# Patient Record
Sex: Female | Born: 1995 | Hispanic: Yes | Marital: Single | Smoking: Never smoker
Health system: Southern US, Community
[De-identification: ages and names within clinical notes are randomized; demographics above are authoritative.]

---
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
## 2016-12-12 ENCOUNTER — Encounter: Payer: Self-pay | Admitting: Obstetrics and Gynecology

## 2017-11-14 ENCOUNTER — Encounter: Payer: Self-pay | Admitting: Emergency Medicine

## 2017-11-14 DIAGNOSIS — M542 Cervicalgia: Secondary | ICD-10-CM | POA: Insufficient documentation

## 2017-11-14 DIAGNOSIS — M791 Myalgia, unspecified site: Secondary | ICD-10-CM | POA: Diagnosis not present

## 2017-11-14 DIAGNOSIS — R51 Headache: Secondary | ICD-10-CM | POA: Diagnosis not present

## 2017-11-14 DIAGNOSIS — Y92410 Unspecified street and highway as the place of occurrence of the external cause: Secondary | ICD-10-CM | POA: Insufficient documentation

## 2017-11-14 DIAGNOSIS — Y998 Other external cause status: Secondary | ICD-10-CM | POA: Insufficient documentation

## 2017-11-14 DIAGNOSIS — Y939 Activity, unspecified: Secondary | ICD-10-CM | POA: Insufficient documentation
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
## 2017-11-15 ENCOUNTER — Other Ambulatory Visit: Payer: Self-pay

## 2017-11-15 ENCOUNTER — Emergency Department
Admission: EM | Admit: 2017-11-15 | Discharge: 2017-11-15 | Disposition: A | Discharge status: Home / Self Care | Payer: No Typology Code available for payment source | Attending: Emergency Medicine | Admitting: Emergency Medicine

## 2017-11-15 ENCOUNTER — Encounter: Payer: Self-pay | Admitting: Emergency Medicine

## 2017-11-15 ENCOUNTER — Emergency Department: Payer: No Typology Code available for payment source

## 2017-11-15 DIAGNOSIS — M7918 Myalgia, other site: Secondary | ICD-10-CM

## 2017-11-15 MED ORDER — IBUPROFEN 600 MG PO TABS
600.0000 mg | ORAL_TABLET | Freq: Once | ORAL | Status: AC
  Administered 2017-11-15: 600 mg via ORAL
  Filled 2017-11-15: qty 1

## 2017-11-15 NOTE — Discharge Instructions (Addendum)
Please follow up with the acute care clinic. Please take tylenol or ibuprofen for pain. Please return with any worsened condition.
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
## 2018-03-29 ENCOUNTER — Ambulatory Visit
Admission: EM | Admit: 2018-03-29 | Discharge: 2018-03-29 | Disposition: A | Discharge status: Home / Self Care | Payer: Self-pay | Attending: Family Medicine | Admitting: Family Medicine

## 2018-03-29 ENCOUNTER — Other Ambulatory Visit: Payer: Self-pay

## 2018-03-29 DIAGNOSIS — B349 Viral infection, unspecified: Secondary | ICD-10-CM

## 2018-03-29 LAB — RAPID INFLUENZA A&B ANTIGENS (ARMC ONLY)
INFLUENZA A (ARMC): NEGATIVE
INFLUENZA B (ARMC): NEGATIVE

## 2018-03-29 NOTE — ED Triage Notes (Signed)
Pt just returned from [place] and wants to be checked for the flu. Joint aches, headache, runny nose and scratchy throat.

## 2018-03-29 NOTE — ED Provider Notes (Signed)
[facility]    CSN: [id number] Arrival date & time: 03/29/18  1152     History   Chief Complaint Chief Complaint  Patient presents with  . Headache  . Generalized Body Aches    HPI Alexandra Evans is a 23 y.o. female.   23 year old female presents with joint aches, headache and runny nose that started over 5 days ago. Was in [place] for 2 weeks and just returned 5 days ago. Only symptom while in [place] was bilateral joint aches and pain. Now having irritated throat and nausea but denies any fever, vomiting, diarrhea or rash. No known exposure to flu or other illnesses. Has taken OTC pain relievers for headache and joint pain with some success. No chronic health issues. Takes no daily medication.   The history is provided by the patient.    History reviewed. No pertinent past medical history.  There are no active problems to display for this patient.   History reviewed. No pertinent surgical history.  OB History   No obstetric history on file.      Home Medications    Prior to Admission medications   Not on File    Family History History reviewed. No pertinent family history.  Social History Social History   Tobacco Use  . Smoking status: Never Smoker  . Smokeless tobacco: Never Used  Substance Use Topics  . Alcohol use: Yes    Comment: social  . Drug use: Never     Allergies   Patient has no known allergies.   Review of Systems Review of Systems  Constitutional: Positive for fatigue. Negative for activity change, appetite change, chills, diaphoresis and fever.  HENT: Positive for postnasal drip, rhinorrhea and sore throat. Negative for congestion, ear discharge, ear pain, facial swelling, mouth sores, nosebleeds, sinus pressure, sinus pain, sneezing and trouble swallowing.   Eyes: Negative for photophobia, pain, discharge, redness, itching and visual disturbance.  Respiratory: Negative for cough, chest tightness, shortness of  breath and wheezing.   Gastrointestinal: Positive for nausea. Negative for abdominal pain, diarrhea and vomiting.  Genitourinary: Negative for decreased urine volume, difficulty urinating and hematuria.  Musculoskeletal: Positive for arthralgias. Negative for back pain, joint swelling, myalgias, neck pain and neck stiffness.  Skin: Negative for color change, rash and wound.  Allergic/Immunologic: Negative for immunocompromised state.  Neurological: Positive for headaches. Negative for dizziness, tremors, seizures, syncope, weakness, light-headedness and numbness.  Hematological: Negative for adenopathy. Does not bruise/bleed easily.     Physical Exam Triage Vital Signs ED Triage Vitals  Enc Vitals Group     BP 03/29/18 1223 107/78     Pulse Rate 03/29/18 1223 (!) 59     Resp 03/29/18 1223 16     Temp 03/29/18 1223 98 F (36.7 C)     Temp Source 03/29/18 1223 Oral     SpO2 03/29/18 1223 100 %     Weight 03/29/18 1227 145 lb (65.8 kg)     Height 03/29/18 1227 5\' 6"  (1.676 m)     Head Circumference --      Peak Flow --      Pain Score 03/29/18 1226 3     Pain Loc --      Pain Edu? --      Excl. in GC? --    No data found.  Updated Vital Signs BP 107/78 (BP Location: Left Arm)   Pulse (!) 59   Temp 98 F (36.7 C) (Oral)   Resp 16  Ht 5\' 6"  (1.676 m)   Wt 145 lb (65.8 kg)   LMP 03/16/2018   SpO2 100%   BMI 23.40 kg/m   Visual Acuity Right Eye Distance:   Left Eye Distance:   Bilateral Distance:    Right Eye Near:   Left Eye Near:    Bilateral Near:     Physical Exam Vitals signs and nursing note reviewed.  Constitutional:      General: She is awake. She is not in acute distress.    Appearance: Normal appearance. She is well-developed, well-groomed and normal weight. She is not ill-appearing.     Comments: Patient sitting comfortably on exam table in no acute distress.   HENT:     Head: Normocephalic and atraumatic.     Right Ear: Hearing, tympanic membrane,  ear canal and external ear normal.     Left Ear: Hearing, tympanic membrane, ear canal and external ear normal.     Nose: Nose normal.     Right Turbinates: Not enlarged or swollen.     Left Turbinates: Not enlarged or swollen.     Right Sinus: No maxillary sinus tenderness or frontal sinus tenderness.     Left Sinus: No maxillary sinus tenderness or frontal sinus tenderness.     Mouth/Throat:     Lips: Pink.     Mouth: Mucous membranes are moist.     Pharynx: Oropharyngeal exudate (slight clear to yellowish post nasal drainage) and posterior oropharyngeal erythema present. No pharyngeal swelling.  Eyes:     Extraocular Movements: Extraocular movements intact.     Conjunctiva/sclera: Conjunctivae normal.  Neck:     Musculoskeletal: Normal range of motion and neck supple. No muscular tenderness.  Cardiovascular:     Rate and Rhythm: Normal rate and regular rhythm.     Pulses: Normal pulses.     Heart sounds: Normal heart sounds. No murmur.  Pulmonary:     Effort: Pulmonary effort is normal. No respiratory distress.     Breath sounds: Normal breath sounds and air entry. No decreased breath sounds, wheezing, rhonchi or rales.  Musculoskeletal: Normal range of motion.  Lymphadenopathy:     Cervical: No cervical adenopathy.  Skin:    General: Skin is warm and dry.     Capillary Refill: Capillary refill takes less than 2 seconds.     Findings: No rash.  Neurological:     General: No focal deficit present.     Mental Status: She is alert and oriented to person, place, and time.  Psychiatric:        Mood and Affect: Mood normal.        Behavior: Behavior normal. Behavior is cooperative.        Thought Content: Thought content normal.        Judgment: Judgment normal.      UC Treatments / Results  Labs (all labs ordered are listed, but only abnormal results are displayed) Labs Reviewed  RAPID INFLUENZA A&B ANTIGENS ([facility] ONLY)    EKG None  Radiology No results  found.  Procedures Procedures (including critical care time)  Medications Ordered in UC Medications - No data to display  Initial Impression / Assessment and Plan / UC Course  I have reviewed the triage vital signs and the nursing notes.  Pertinent labs & imaging results that were available during my care of the patient were reviewed by me and considered in my medical decision making (see chart for details).    Reviewed negative rapid influenza  test with patient. Discussed limitations of testing and dx made based on history, clinical findings and community presence. Doubt she has influenza- may have a similar viral illness. Since no fever or rash, doubt malaria or vector borne illness. Will continue to monitor symptoms. May continue Tylenol 1000mg  or Ibuprofen 600mg  every 8 hours as needed for body aches. Increase fluids to help loosen up any mucus in sinuses. Follow-up here in 4 to 5 days if not improving or sooner if symptoms worsen. Also provided information on [facility] and [facility] for patient to call to establish care as a PCP.  Final Clinical Impressions(s) / UC Diagnoses   Final diagnoses:  Viral illness     Discharge Instructions     Recommend continue OTC Ibuprofen or Tylenol as needed for joint pain. Increase fluid intake to help loosen up any mucus. Follow-up here in 4 to 5 days if not improving or sooner if symptoms worsen. Also [facility] or [facility] to establish care as a PCP.     ED Prescriptions    None     Controlled Substance Prescriptions [place] Controlled Substance Registry consulted? Not Applicable   Sudie Grumblingmyot,  Berry, NP 03/30/18 1110

## 2018-03-29 NOTE — Discharge Instructions (Addendum)
Recommend continue OTC Ibuprofen or Tylenol as needed for joint pain. Increase fluid intake to help loosen up any mucus. Follow-up here in 4 to 5 days if not improving or sooner if symptoms worsen. Also call either [facility] or [facility] to establish care as a PCP.

## 2019-01-27 ENCOUNTER — Other Ambulatory Visit: Payer: Self-pay | Admitting: *Deleted

## 2019-01-27 DIAGNOSIS — Z20822 Contact with and (suspected) exposure to covid-19: Secondary | ICD-10-CM

## 2019-01-28 LAB — NOVEL CORONAVIRUS, NAA: SARS-CoV-2, NAA: NOT DETECTED

## 2019-02-26 ENCOUNTER — Other Ambulatory Visit: Payer: Self-pay

## 2019-02-26 DIAGNOSIS — Z20822 Contact with and (suspected) exposure to covid-19: Secondary | ICD-10-CM

## 2019-03-01 LAB — NOVEL CORONAVIRUS, NAA: SARS-CoV-2, NAA: NOT DETECTED

## 2019-03-12 ENCOUNTER — Ambulatory Visit: Payer: Self-pay | Attending: Internal Medicine

## 2019-03-12 DIAGNOSIS — Z20822 Contact with and (suspected) exposure to covid-19: Secondary | ICD-10-CM

## 2019-03-13 LAB — NOVEL CORONAVIRUS, NAA: SARS-CoV-2, NAA: NOT DETECTED

## 2019-04-01 ENCOUNTER — Ambulatory Visit: Payer: 59 | Attending: Internal Medicine

## 2019-04-01 DIAGNOSIS — Z20822 Contact with and (suspected) exposure to covid-19: Secondary | ICD-10-CM

## 2019-04-04 LAB — NOVEL CORONAVIRUS, NAA: SARS-CoV-2, NAA: DETECTED — AB
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
